# Patient Record
Sex: Male | Born: 1980 | Race: Black or African American | Hispanic: No | Marital: Single | Smoking: Current every day smoker
Health system: Southern US, Community
[De-identification: ages and names within clinical notes are randomized; demographics above are authoritative.]

---

## 2012-03-13 ENCOUNTER — Encounter (HOSPITAL_COMMUNITY): Payer: Self-pay | Admitting: Emergency Medicine

## 2012-03-13 ENCOUNTER — Emergency Department (HOSPITAL_COMMUNITY)
Admission: EM | Admit: 2012-03-13 | Discharge: 2012-03-13 | Disposition: A | Discharge status: Home / Self Care | Payer: Self-pay | Attending: Emergency Medicine | Admitting: Emergency Medicine

## 2012-03-13 DIAGNOSIS — R51 Headache: Secondary | ICD-10-CM | POA: Insufficient documentation

## 2012-03-13 DIAGNOSIS — R42 Dizziness and giddiness: Secondary | ICD-10-CM | POA: Insufficient documentation

## 2012-03-13 NOTE — ED Notes (Signed)
Pt states going outside to smoke. Pt informed that if called from waiting room to go to back pt will need to be present. Pt informed 5-6 pt ahead of him. Pt states will return.

## 2012-03-13 NOTE — ED Notes (Signed)
C/o dizziness and headache since yesterday.  Reports he had hives yesterday that went away.

## 2012-03-13 NOTE — ED Notes (Signed)
Pt has not returned. Pt called in waiting room no answer.

## 2012-03-13 NOTE — ED Notes (Signed)
23:10 note was entered by myself.  Pt also called outside-no answer

## 2012-03-18 ENCOUNTER — Emergency Department (HOSPITAL_COMMUNITY): Payer: Self-pay

## 2012-03-18 ENCOUNTER — Emergency Department (HOSPITAL_COMMUNITY)
Admission: EM | Admit: 2012-03-18 | Discharge: 2012-03-18 | Disposition: A | Discharge status: Home / Self Care | Payer: Self-pay | Attending: Emergency Medicine | Admitting: Emergency Medicine

## 2012-03-18 DIAGNOSIS — S52599A Other fractures of lower end of unspecified radius, initial encounter for closed fracture: Secondary | ICD-10-CM | POA: Insufficient documentation

## 2012-03-18 DIAGNOSIS — W19XXXA Unspecified fall, initial encounter: Secondary | ICD-10-CM | POA: Insufficient documentation

## 2012-03-18 DIAGNOSIS — S52509A Unspecified fracture of the lower end of unspecified radius, initial encounter for closed fracture: Secondary | ICD-10-CM

## 2012-03-18 DIAGNOSIS — F17200 Nicotine dependence, unspecified, uncomplicated: Secondary | ICD-10-CM | POA: Insufficient documentation

## 2012-03-18 MED ORDER — OXYCODONE-ACETAMINOPHEN 5-325 MG PO TABS
2.0000 | ORAL_TABLET | Freq: Once | ORAL | Status: AC
  Administered 2012-03-18: 2 via ORAL
  Filled 2012-03-18: qty 2

## 2012-03-18 MED ORDER — OXYCODONE-ACETAMINOPHEN 5-325 MG PO TABS
1.0000 | ORAL_TABLET | Freq: Four times a day (QID) | ORAL | Status: AC | PRN
Start: 2012-03-18 — End: 2012-03-28

## 2012-03-18 NOTE — Progress Notes (Signed)
Orthopedic Tech Progress Note Patient Details:  Dennis Calhoun 1981-03-06 [id number]  Ortho Devices Type of Ortho Device: Thumb velcro splint;Ace wrap Ortho Device/Splint Location: (R) UE Ortho Device/Splint Interventions: Application   [name] 03/18/2012, 7:05 PM

## 2012-03-18 NOTE — ED Notes (Signed)
Believe to broke rt. wrist when falling last night. Pt. States, "drunk."  Onset: 03/17/12 @ 2200. The wrist is red, swollen. Cms intact.

## 2012-03-18 NOTE — Progress Notes (Signed)
Orthopedic Tech Progress Note Patient Details:  Dennis Calhoun [birth year] [id number]  Ortho Devices Type of Ortho Device: Arm foam sling Ortho Device/Splint Location: (R) UE Ortho Device/Splint Interventions: Application   [name] 03/18/2012, 7:12 PM

## 2012-03-18 NOTE — ED Notes (Signed)
Ortho notified for splint.  

## 2012-03-18 NOTE — ED Provider Notes (Signed)
History   This chart was scribed for Charles B. Bernette Mayers, MD by Toya Smothers. The patient was seen in room TR06C/TR06C. Patient's care was started at 1750.  CSN: [id number]  Arrival date & time 03/18/12  1750   First MD Initiated Contact with Patient 03/18/12 1831      Chief Complaint  Patient presents with  . Wrist Pain    HPI  Dennis Calhoun is a 31 y.o. male who presents to the Emergency Department complaining of sudden onset moderate severe R wrist pain onset this morning. Pain is worse with movement. Pt reports that he was intoxicated last night and does not remember how he injured his wrist. Pt is R hand dominate. Pt is a current everyday smoker.   No past medical history on file.  No past surgical history on file.  No family history on file.  History  Substance Use Topics  . Smoking status: Current Everyday Smoker  . Smokeless tobacco: Not on file  . Alcohol Use: Yes      Review of Systems  Constitutional: Negative for fever and chills.  HENT: Negative for rhinorrhea and neck pain.   Eyes: Negative for pain.  Respiratory: Negative for cough and shortness of breath.   Cardiovascular: Negative for chest pain.  Gastrointestinal: Negative for nausea, vomiting, abdominal pain and diarrhea.  Genitourinary: Negative for dysuria.  Musculoskeletal: Positive for arthralgias (R wrist pain.). Negative for back pain.  Skin: Negative for rash.  Neurological: Negative for dizziness and weakness.  All other systems reviewed and are negative.    Allergies  Review of patient's allergies indicates no known allergies.  Home Medications  No current outpatient prescriptions on file.  BP 128/83  Pulse 77  Temp 98.7 F (37.1 C) (Oral)  Resp 16  SpO2 98%  Physical Exam  Nursing note and vitals reviewed. Constitutional: He is oriented to person, place, and time. He appears well-developed and well-nourished. No distress.  HENT:  Head: Normocephalic and atraumatic.  Eyes: EOM  are normal. Pupils are equal, round, and reactive to light.  Neck: Neck supple. No tracheal deviation present.  Cardiovascular: Normal rate.   Pulmonary/Chest: Effort normal. No respiratory distress.  Abdominal: Soft. He exhibits no distension.  Musculoskeletal: Normal range of motion. He exhibits no edema.       Swelling and tenderness over the distal right radius.  Neurological: He is alert and oriented to person, place, and time. No sensory deficit.  Skin: Skin is warm and dry.  Psychiatric: He has a normal mood and affect. His behavior is normal.    ED Course  Procedures (including critical care time)  Labs Reviewed - No data to display Dg Wrist Complete Right  03/18/2012  *RADIOLOGY REPORT*  Clinical Data: Pain and swelling after fall on right wrist.  Fall on outstretched hand.  RIGHT WRIST - COMPLETE 3+ VIEW  Comparison: None.  Findings: Transverse nondisplaced distal radius fracture is present with intra-articular extension into the radiocarpal joint.  Diffuse soft tissue swelling is present of hematoma in the soft tissues of the dorsal wrist.  There is loss of the normal volar tilt.  Carpal spacing and alignment appear normal.  Dedicated scaphoid view shows a normal appearance of the scaphoid without fracture. Distal ulna intact.  IMPRESSION: Transverse nondisplaced intra-articular distal radius fracture with loss of the normal volar tilt.  Original Report Authenticated By: Andreas Newport, M.D.     No diagnosis found.    MDM  Xray as above. Placed in thumb spica  splint, sling for comfort. Advised Hand followup for recheck and casting.        I personally performed the services described in the documentation, which were scribed in my presence. The recorded information has been reviewed and considered.     Charles B. Bernette Mayers, MD 03/18/12 1610

## 2012-03-18 NOTE — ED Notes (Signed)
Patient states that  He broke his right wrist. Patient states that he fell last night. Patient cannot move his wrist or wiggle his fingers. Swelling and redness noted around his right wrist.

## 2012-05-10 ENCOUNTER — Emergency Department (HOSPITAL_COMMUNITY): Payer: Self-pay

## 2012-05-10 ENCOUNTER — Emergency Department (HOSPITAL_COMMUNITY)
Admission: EM | Admit: 2012-05-10 | Discharge: 2012-05-10 | Disposition: A | Discharge status: Home / Self Care | Payer: Self-pay | Attending: Emergency Medicine | Admitting: Emergency Medicine

## 2012-05-10 DIAGNOSIS — F17200 Nicotine dependence, unspecified, uncomplicated: Secondary | ICD-10-CM | POA: Insufficient documentation

## 2012-05-10 DIAGNOSIS — M79603 Pain in arm, unspecified: Secondary | ICD-10-CM

## 2012-05-10 DIAGNOSIS — M25539 Pain in unspecified wrist: Secondary | ICD-10-CM | POA: Insufficient documentation

## 2012-05-10 DIAGNOSIS — Z8781 Personal history of (healed) traumatic fracture: Secondary | ICD-10-CM | POA: Insufficient documentation

## 2012-05-10 MED ORDER — KETOROLAC TROMETHAMINE 30 MG/ML IJ SOLN
30.0000 mg | Freq: Once | INTRAMUSCULAR | Status: AC
  Administered 2012-05-10: 30 mg via INTRAMUSCULAR
  Filled 2012-05-10: qty 1

## 2012-05-10 MED ORDER — HYDROCODONE-ACETAMINOPHEN 5-325 MG PO TABS: 1.0000 | ORAL_TABLET | Freq: Three times a day (TID) | ORAL | Status: AC | PRN

## 2012-05-10 MED ORDER — KETOROLAC TROMETHAMINE 30 MG/ML IJ SOLN: 30.0000 mg | Freq: Once | INTRAMUSCULAR | Status: DC

## 2012-05-10 MED ORDER — IBUPROFEN 200 MG PO TABS: 800.0000 mg | ORAL_TABLET | Freq: Three times a day (TID) | ORAL | Status: AC

## 2012-05-10 NOTE — ED Notes (Signed)
Pt states he broke his R wrist and a bone in the R hand 1 month ago.  Pt states the wrist is painful. Pain 6/10. Pt denies additional injury to the R wrist. Pt can touch thumb to each finger. Limited wrist flexion and extension in R wrist.

## 2012-05-10 NOTE — ED Provider Notes (Signed)
History   This chart was scribed for Dennis Munch, MD by Melba Coon. The patient was seen in room TR10C/TR10C and the patient's care was started at 3:07PM.    CSN: [id number]  Arrival date & time 05/10/12  1400   First MD Initiated Contact with Patient 05/10/12 1410      Chief Complaint  Patient presents with  . Arm Pain    (Consider location/radiation/quality/duration/timing/severity/associated sxs/prior treatment) The history is provided by the patient. No language interpreter was used.   Dennis Calhoun is a 31 y.o. male who presents to the Emergency Department complaining of constant, moderate to severe right wrist pain and tingling with an onset last week that has been getting progressively worse. Pt broke his arm several months and was taking tylenol and motrin which did not alleviate the pain. No new injury or trauma to the affected area. Pain started to come back around onset. HA present. No fever, neck pain, sore throat, rash, back pain, CP, SOB, abd pain, n/v/d, dysuria, or extremity edema, weakness, or numbness. Pt fractured the wrist several months ago, but pt states that there is no new injury or trauma. No known allergies. No other pertinent medical symptoms.  No past medical history on file.  No past surgical history on file.  No family history on file.  History  Substance Use Topics  . Smoking status: Current Everyday Smoker  . Smokeless tobacco: Not on file  . Alcohol Use: Yes      Review of Systems 10 Systems reviewed and all are negative for acute change except as noted in the HPI.   Allergies  Review of patient's allergies indicates no known allergies.  Home Medications   Current Outpatient Rx  Name Route Sig Dispense Refill  . IBUPROFEN 200 MG PO TABS Oral Take 400-600 mg by mouth every 6 (six) hours as needed. For pain      BP 108/65  Pulse 75  Temp 98.9 F (37.2 C) (Oral)  Resp 16  SpO2 99%  Physical Exam  Nursing note and vitals  reviewed. Constitutional: He is oriented to person, place, and time. He appears well-developed and well-nourished. No distress.  HENT:  Head: Normocephalic and atraumatic.  Eyes: EOM are normal.  Neck: Neck supple. No tracheal deviation present.  Cardiovascular: Normal rate.   Pulmonary/Chest: Effort normal. No respiratory distress.  Musculoskeletal: Normal range of motion. He exhibits tenderness (Atrophy on right hand diffusely; pain above right wrist on palmar aspect).  Neurological: He is alert and oriented to person, place, and time.  Skin: Skin is warm and dry.  Psychiatric: He has a normal mood and affect. His behavior is normal.    ED Course  Procedures (including critical care time)  DIAGNOSTIC STUDIES: Oxygen Saturation is 99% on room air, normal by my interpretation.    COORDINATION OF CARE:  3:11PM -  A shot of torodol and applied ice will be given to the pt. Right wrist XR will be ordered for the pt.   Labs Reviewed - No data to display No results found.   No diagnosis found.    MDM  I personally performed the services described in this documentation, which was scribed in my presence. The recorded information has been reviewed and considered.  This young male with a recent history of right radius fracture presents with concerns of chest pain.  On exam the patient is tenderness to palpation about the wrist, but no other notable findings.  The patient's x-ray demonstrates a healed  radial styloid fracture.  The patient was advised on analgesic, anti-inflammatories, provided hand followup.   Dennis Munch, MD 05/10/12 1616

## 2012-05-10 NOTE — ED Notes (Signed)
Pt c/o right wrist pain after having fracture several months ago; pt sts increased pain but denies any new injury

## 2013-12-04 ENCOUNTER — Emergency Department (HOSPITAL_COMMUNITY): Payer: Self-pay

## 2013-12-04 ENCOUNTER — Emergency Department (HOSPITAL_COMMUNITY)
Admission: EM | Admit: 2013-12-04 | Discharge: 2013-12-04 | Disposition: A | Discharge status: Home / Self Care | Payer: Self-pay | Attending: Emergency Medicine | Admitting: Emergency Medicine

## 2013-12-04 ENCOUNTER — Encounter (HOSPITAL_COMMUNITY): Payer: Self-pay | Admitting: Emergency Medicine

## 2013-12-04 DIAGNOSIS — F17200 Nicotine dependence, unspecified, uncomplicated: Secondary | ICD-10-CM | POA: Insufficient documentation

## 2013-12-04 DIAGNOSIS — N23 Unspecified renal colic: Secondary | ICD-10-CM | POA: Insufficient documentation

## 2013-12-04 LAB — COMPREHENSIVE METABOLIC PANEL
ALT: 29 U/L (ref 0–53)
AST: 21 U/L (ref 0–37)
Albumin: 3.6 g/dL (ref 3.5–5.2)
Alkaline Phosphatase: 50 U/L (ref 39–117)
BILIRUBIN TOTAL: 0.2 mg/dL — AB (ref 0.3–1.2)
BUN: 17 mg/dL (ref 6–23)
CALCIUM: 9.4 mg/dL (ref 8.4–10.5)
CO2: 25 meq/L (ref 19–32)
CREATININE: 1.12 mg/dL (ref 0.50–1.35)
Chloride: 102 mEq/L (ref 96–112)
GFR, EST NON AFRICAN AMERICAN: 86 mL/min — AB (ref >90)
GLUCOSE: 120 mg/dL — AB (ref 70–99)
Potassium: 4.3 mEq/L (ref 3.7–5.3)
Sodium: 140 mEq/L (ref 137–147)
Total Protein: 6.9 g/dL (ref 6.0–8.3)

## 2013-12-04 LAB — URINE MICROSCOPIC-ADD ON

## 2013-12-04 LAB — CBC WITH DIFFERENTIAL/PLATELET
Basophils Absolute: 0 10*3/uL (ref 0.0–0.1)
Basophils Relative: 0 % (ref 0–1)
EOS PCT: 1 % (ref 0–5)
Eosinophils Absolute: 0.1 10*3/uL (ref 0.0–0.7)
HEMATOCRIT: 44.4 % (ref 39.0–52.0)
HEMOGLOBIN: 14.9 g/dL (ref 13.0–17.0)
LYMPHS ABS: 3.4 10*3/uL (ref 0.7–4.0)
LYMPHS PCT: 36 % (ref 12–46)
MCH: 29.9 pg (ref 26.0–34.0)
MCHC: 33.6 g/dL (ref 30.0–36.0)
MCV: 89.2 fL (ref 78.0–100.0)
MONO ABS: 1 10*3/uL (ref 0.1–1.0)
MONOS PCT: 11 % (ref 3–12)
Neutro Abs: 5 10*3/uL (ref 1.7–7.7)
Neutrophils Relative %: 52 % (ref 43–77)
Platelets: 241 10*3/uL (ref 150–400)
RBC: 4.98 MIL/uL (ref 4.22–5.81)
RDW: 15.4 % (ref 11.5–15.5)
WBC: 9.6 10*3/uL (ref 4.0–10.5)

## 2013-12-04 LAB — URINALYSIS, ROUTINE W REFLEX MICROSCOPIC
BILIRUBIN URINE: NEGATIVE
GLUCOSE, UA: NEGATIVE mg/dL
KETONES UR: NEGATIVE mg/dL
LEUKOCYTES UA: NEGATIVE
Nitrite: NEGATIVE
PH: 7 (ref 5.0–8.0)
Protein, ur: NEGATIVE mg/dL
Specific Gravity, Urine: 1.014 (ref 1.005–1.030)
Urobilinogen, UA: 0.2 mg/dL (ref 0.0–1.0)

## 2013-12-04 MED ORDER — METOCLOPRAMIDE HCL 10 MG PO TABS: 10.0000 mg | ORAL_TABLET | Freq: Four times a day (QID) | ORAL | Status: AC | PRN

## 2013-12-04 MED ORDER — ONDANSETRON HCL 4 MG/2ML IJ SOLN
4.0000 mg | Freq: Once | INTRAMUSCULAR | Status: AC
  Administered 2013-12-04: 4 mg via INTRAVENOUS
  Filled 2013-12-04: qty 2

## 2013-12-04 MED ORDER — MORPHINE SULFATE 4 MG/ML IJ SOLN: 8.0000 mg | Freq: Once | INTRAMUSCULAR | Status: DC

## 2013-12-04 MED ORDER — KETOROLAC TROMETHAMINE 30 MG/ML IJ SOLN
30.0000 mg | Freq: Once | INTRAMUSCULAR | Status: DC
Start: 2013-12-04 — End: 2013-12-04
  Filled 2013-12-04: qty 1

## 2013-12-04 MED ORDER — ONDANSETRON 8 MG PO TBDP: ORAL_TABLET | ORAL | Status: AC

## 2013-12-04 MED ORDER — OXYCODONE-ACETAMINOPHEN 5-325 MG PO TABS: 2.0000 | ORAL_TABLET | ORAL | Status: AC | PRN

## 2013-12-04 MED ORDER — MORPHINE SULFATE 4 MG/ML IJ SOLN
4.0000 mg | Freq: Once | INTRAMUSCULAR | Status: AC
  Administered 2013-12-04: 4 mg via INTRAVENOUS
  Filled 2013-12-04: qty 1

## 2013-12-04 MED ORDER — KETOROLAC TROMETHAMINE 30 MG/ML IJ SOLN
30.0000 mg | Freq: Once | INTRAMUSCULAR | Status: AC
  Administered 2013-12-04: 30 mg via INTRAVENOUS
  Filled 2013-12-04: qty 1

## 2013-12-04 MED ORDER — MORPHINE SULFATE 4 MG/ML IJ SOLN
8.0000 mg | Freq: Once | INTRAMUSCULAR | Status: AC
  Administered 2013-12-04: 8 mg via INTRAVENOUS
  Filled 2013-12-04: qty 2

## 2013-12-04 NOTE — ED Notes (Addendum)
Patient transported to CT 

## 2013-12-04 NOTE — ED Notes (Signed)
To ED via [facility] from home, with c/o severe right flank pain that woke up from sleep-- vomited at home. Writhing in pain for EMS

## 2013-12-04 NOTE — Discharge Planning (Signed)
[facility] Felicia E, [facility]  Spoke to patient about primary care resources and establishing care with a provider. Patient states he will be relocating soon, resource guide given. My contact information was also provided for any future questions or concerns.

## 2013-12-04 NOTE — Discharge Instructions (Signed)
Return to the ED immediately if you develop fever, uncontrolled pain or vomiting, or other concerns. ° °

## 2013-12-04 NOTE — ED Provider Notes (Signed)
CSN: [id number]     Arrival date & time 12/04/13  [id number] History   First MD Initiated Contact with Patient 12/04/13 0818     Chief Complaint  Patient presents with  . Flank Pain     (Consider location/radiation/quality/duration/timing/severity/associated sxs/prior Treatment) HPI 33 year old male no history of kidney stones no history of trauma recently woke up within the last 2 hours with sudden severe colicky right flank pain radiating to right lower quadrant with nausea and one episode of nonbloody vomiting with no testicular pain no dysuria no hematuria no chest pain cough shortness breath no midline back pain no pain to his legs no weakness or numbness or change in bowel or bladder function no treatment prior to arrival pain is severe not worse with position changes and there is no treatment prior to arrival. History reviewed. No pertinent past medical history. History reviewed. No pertinent past surgical history. History reviewed. No pertinent family history. History  Substance Use Topics  . Smoking status: Current Every Day Smoker  . Smokeless tobacco: Not on file  . Alcohol Use: Yes    Review of Systems 10 Systems reviewed and are negative for acute change except as noted in the HPI.   Allergies  Review of patient's allergies indicates no known allergies.  Home Medications   Current Outpatient Rx  Name  Route  Sig  Dispense  Refill  . metoCLOPramide (REGLAN) 10 MG tablet   Oral   Take 1 tablet (10 mg total) by mouth every 6 (six) hours as needed for nausea (nausea/headache).   6 tablet   0   . ondansetron (ZOFRAN ODT) 8 MG disintegrating tablet      8mg  ODT q4 hours prn nausea   4 tablet   0   . oxyCODONE-acetaminophen (PERCOCET) 5-325 MG per tablet   Oral   Take 2 tablets by mouth every 4 (four) hours as needed.   20 tablet   0    BP 123/72  Pulse 56  Temp(Src) 98.4 F (36.9 C) (Oral)  Resp 18  SpO2 98% Physical Exam  Nursing note and vitals  reviewed. Constitutional:  Awake, alert, nontoxic appearance.  HENT:  Head: Atraumatic.  Eyes: Right eye exhibits no discharge. Left eye exhibits no discharge.  Neck: Neck supple.  Cardiovascular: Normal rate and regular rhythm.   No murmur heard. Pulmonary/Chest: Effort normal and breath sounds normal. No respiratory distress. He has no wheezes. He has no rales. He exhibits no tenderness.  Abdominal: Soft. Bowel sounds are normal. He exhibits no distension and no mass. There is tenderness. There is no rebound and no guarding.  Minimal right upper and lower quadrant tenderness left side of the abdomen is nontender no rebound tenderness  Genitourinary:  Positive right CVA tenderness no left CVA tenderness testicles are nontender no palpable inguinal hernias  Musculoskeletal: He exhibits no tenderness.  Baseline ROM, no obvious new focal weakness.  Neurological:  Mental status and motor strength appears baseline for patient and situation.  Skin: No rash noted.  Psychiatric: He has a normal mood and affect.    ED Course  Procedures (including critical care time) Await U/A. 1130 Pt feels improved after observation and/or treatment in ED.Patient / Family / Caregiver informed of clinical course, understand medical decision-making process, and agree with plan. Labs Review Labs Reviewed  COMPREHENSIVE METABOLIC PANEL - Abnormal; Notable for the following:    Glucose, Bld 120 (*)    Total Bilirubin 0.2 (*)    GFR calc non Af  Amer 73 (*)    All other components within normal limits  URINALYSIS, ROUTINE W REFLEX MICROSCOPIC - Abnormal; Notable for the following:    Hgb urine dipstick SMALL (*)    All other components within normal limits  CBC WITH DIFFERENTIAL  URINE MICROSCOPIC-ADD ON   Imaging Review Ct Abdomen Pelvis Wo Contrast  12/04/2013   CLINICAL DATA:  FLANK PAIN  EXAM: CT ABDOMEN AND PELVIS WITHOUT CONTRAST  TECHNIQUE: Multidetector CT imaging of the abdomen and pelvis was  performed following the standard protocol without intravenous contrast.  COMPARISON:  None  FINDINGS: The lung bases are unremarkable.  Noncontrast evaluation of the liver, spleen, adrenals, pancreas is unremarkable.  There is minimal hydronephrosis and minimal hydroureter involving the right urinary collecting system. A 2 mm calculus is appreciated within the vesicoureteral junction on the right.  The  left kidney is unremarkable.  There is no evidence of calcified gallstones. There is no evidence of bowel obstruction, enteritis, colitis, diverticulitis, nor appendicitis within the limitations of a noncontrast CT. There is no evidence of abdominal aortic aneurysm. No abdominal or pelvic masses, free fluid, loculated fluid collections or adenopathy appreciated within the limitations of a noncontrast CT.  Phleboliths appreciated within the pelvis.  There is no evidence of aggressive appearing osseous lesions.  There is no CT evidence of abdominal wall nor inguinal hernia.  IMPRESSION: 2 mm right vesicoureteral junction calculus with associated minimal obstructive uropathy.   Electronically Signed   By: Salome Holmes M.D.   On: 12/04/2013 09:01     EKG Interpretation None      MDM   Final diagnoses:  Renal colic    I doubt any other EMC precluding discharge at this time including, but not necessarily limited to the following:SBI.    Hurman Horn, MD 12/05/13 [phone]

## 2015-07-08 IMAGING — CT CT ABD-PELV W/O CM
2 of 4 series · 17 of 46 positions shown, 19 images · non-contrast
Comparison: None

CLINICAL DATA: FLANK PAIN

EXAM:
CT ABDOMEN AND PELVIS WITHOUT CONTRAST
TECHNIQUE: Multidetector CT imaging of the abdomen and pelvis was performed
following the standard protocol without intravenous contrast.

[Series 5: stone study · axial · 0.68mm/px · z∈[-482,-57]mm · 14 of 93 slices shown, 16 images]
[im 4/93  soft-tissue]
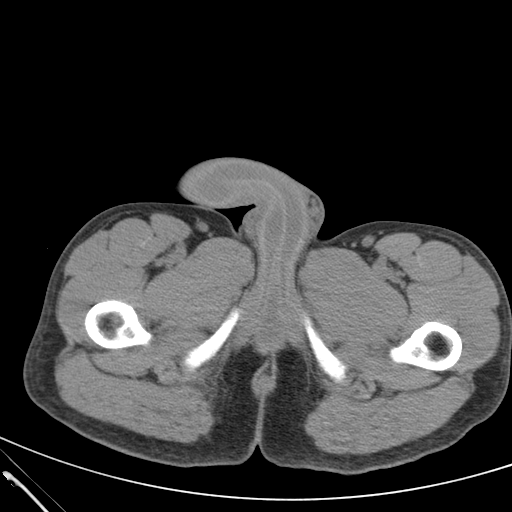
[im 4/93  bone]
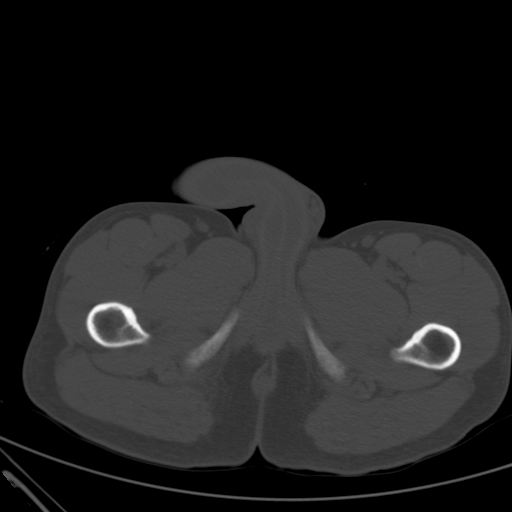
[im 12/93  soft-tissue]
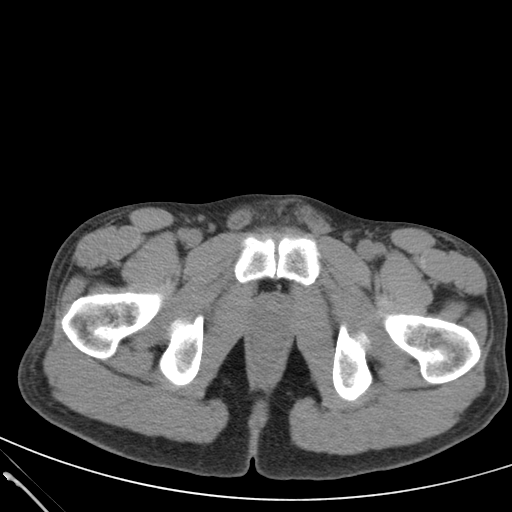
[im 20/93  soft-tissue]
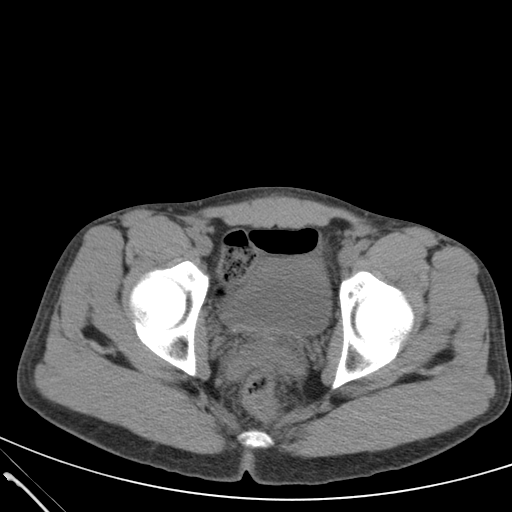
[im 24/93  soft-tissue]
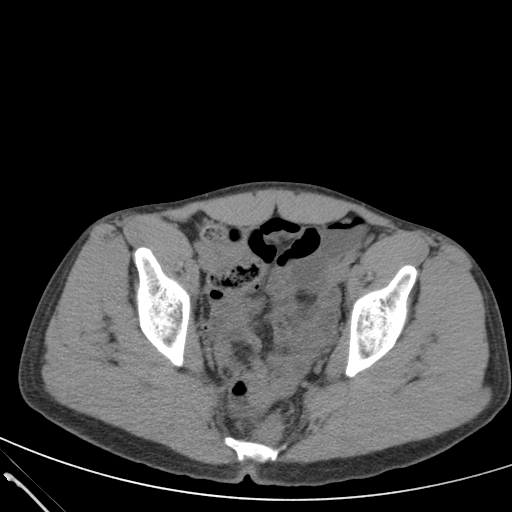
[im 31/93  soft-tissue]
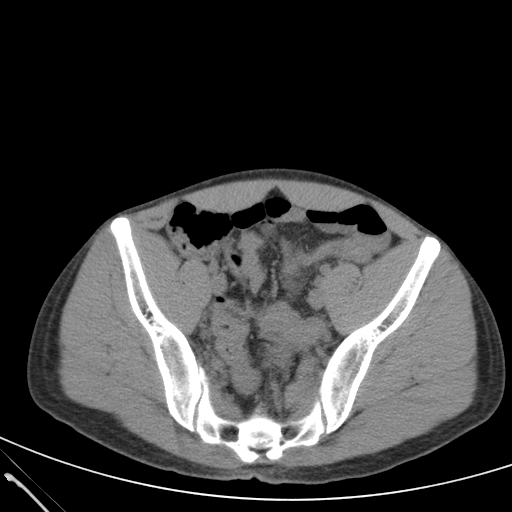
[im 39/93  soft-tissue]
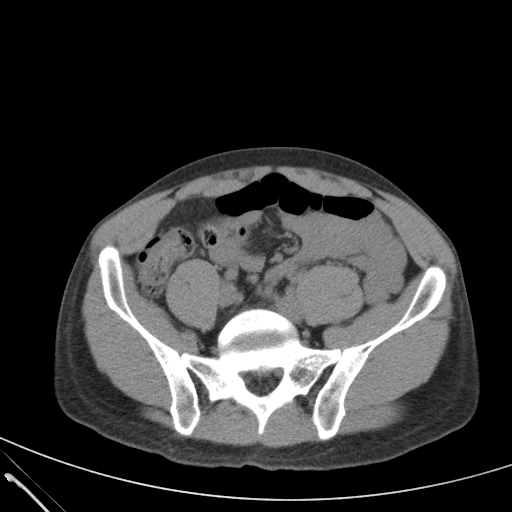
[im 43/93  soft-tissue]
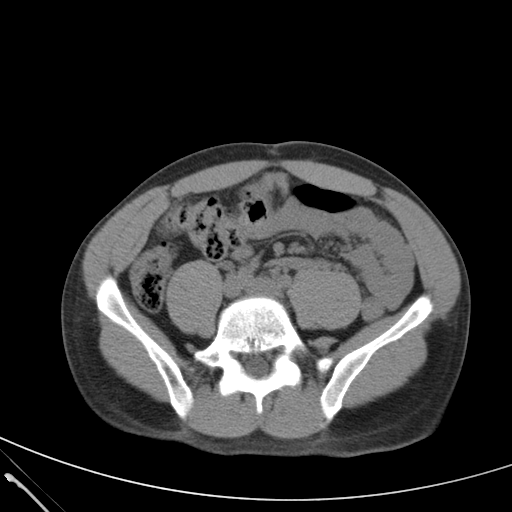
[im 50/93  soft-tissue]
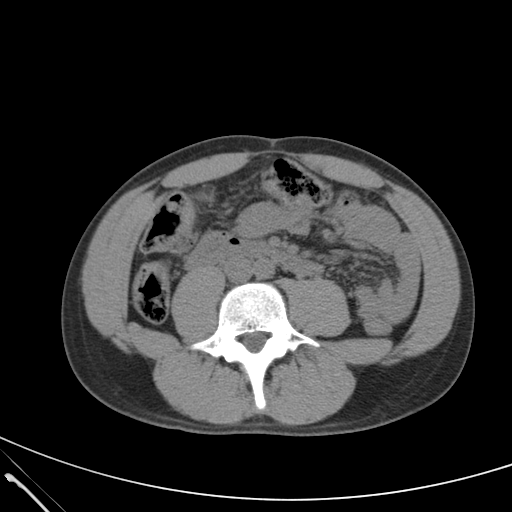
[im 54/93  soft-tissue]
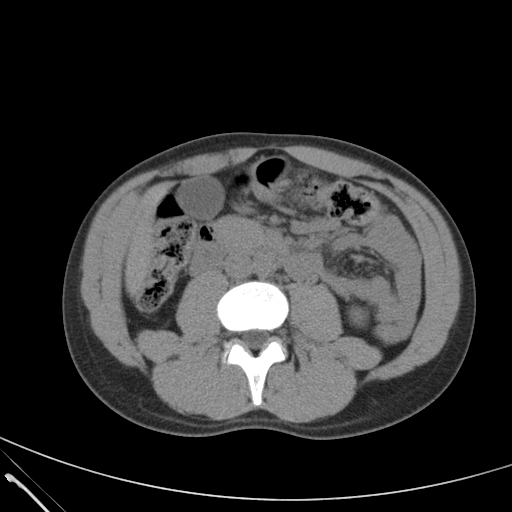
[im 54/93  bone]
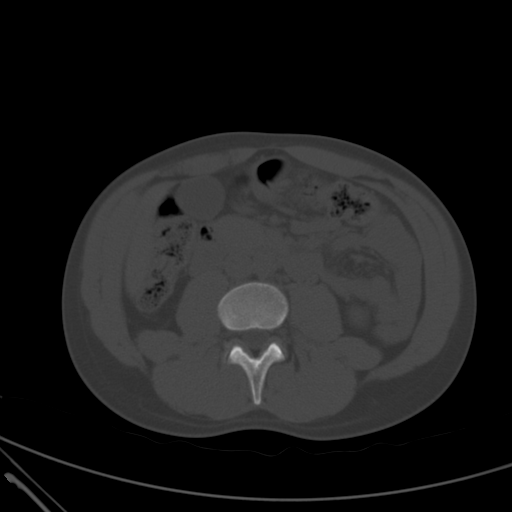
[im 62/93  soft-tissue]
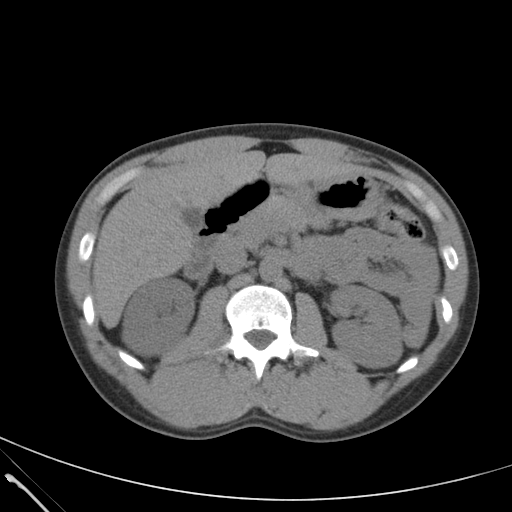
[im 70/93  soft-tissue]
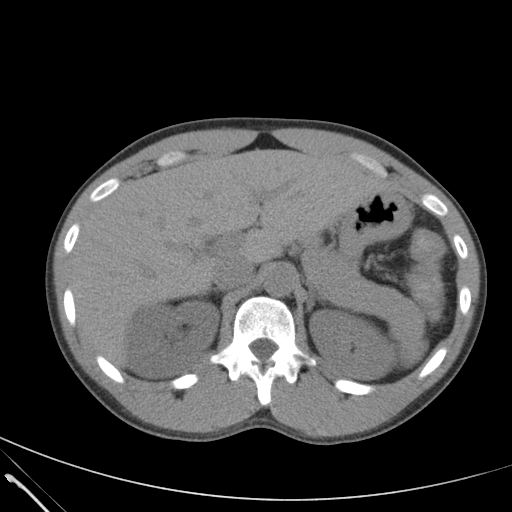
[im 73/93  soft-tissue]
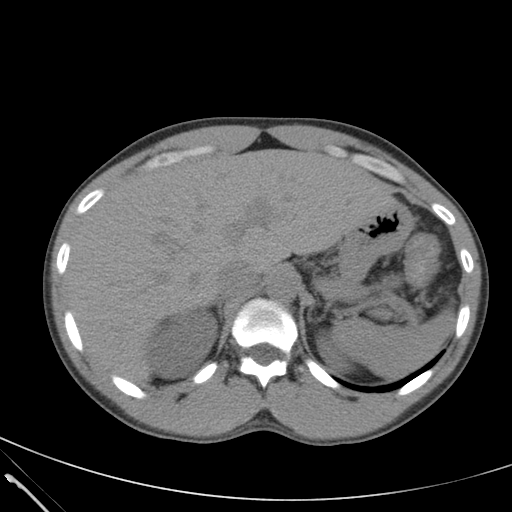
[im 81/93  soft-tissue]
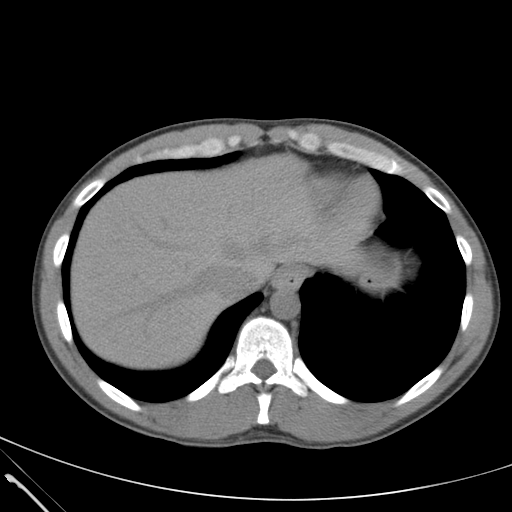
[im 89/93  soft-tissue]
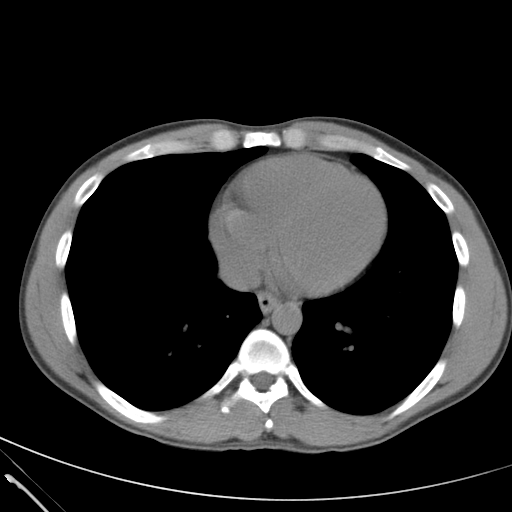

[mpr, coronals, coronal · coronal · 0.90mm/px · 3 of 98 slices shown]
[im 33/98  soft-tissue]
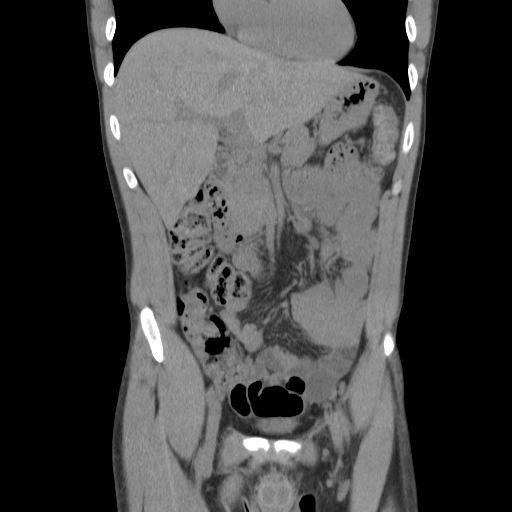
[im 44/98  soft-tissue]
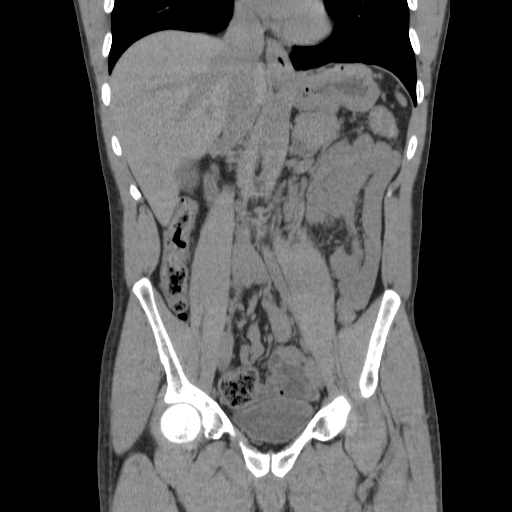
[im 54/98  soft-tissue]
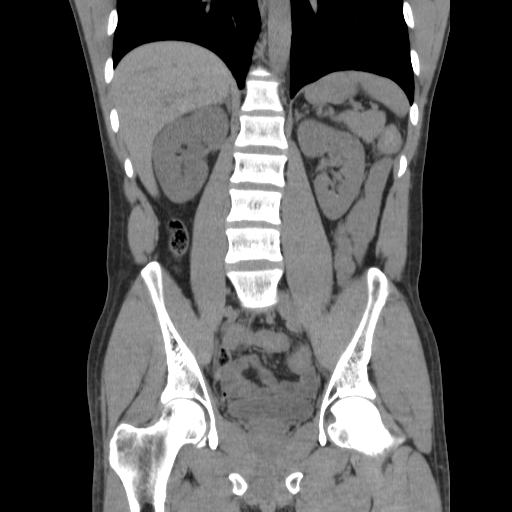

[17 of 46 positions shown; findings below may reference images not displayed]

FINDINGS: The lung bases are unremarkable.

Noncontrast evaluation of the liver, spleen, adrenals, pancreas is
unremarkable.

There is minimal hydronephrosis and minimal hydroureter involving
the right urinary collecting system. A 2 mm calculus is appreciated
within the vesicoureteral junction on the right.

The  left kidney is unremarkable.

There is no evidence of calcified gallstones. There is no evidence
of bowel obstruction, enteritis, colitis, diverticulitis, nor
appendicitis within the limitations of a noncontrast CT. There is no
evidence of abdominal aortic aneurysm. No abdominal or pelvic
masses, free fluid, loculated fluid collections or adenopathy
appreciated within the limitations of a noncontrast CT.

Phleboliths appreciated within the pelvis.

There is no evidence of aggressive appearing osseous lesions.

There is no CT evidence of abdominal wall nor inguinal hernia.
IMPRESSION: 2 mm right vesicoureteral junction calculus with associated minimal
obstructive uropathy.
